# Patient Record
Sex: Male | Born: 2005 | Race: White | Hispanic: Yes | Marital: Married | Smoking: Never smoker
Health system: Southern US, Community
[De-identification: ages and names within clinical notes are randomized; demographics above are authoritative.]

## PROBLEM LIST (undated history)

## (undated) DIAGNOSIS — F909 Attention-deficit hyperactivity disorder, unspecified type: Secondary | ICD-10-CM

## (undated) DIAGNOSIS — F419 Anxiety disorder, unspecified: Secondary | ICD-10-CM

---
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
## 2024-01-12 ENCOUNTER — Telehealth: Payer: Self-pay | Admitting: Orthopedic Surgery

## 2024-02-05 ENCOUNTER — Ambulatory Visit: Admitting: Physician Assistant

## 2024-02-05 ENCOUNTER — Encounter: Payer: Self-pay | Admitting: Physician Assistant

## 2024-02-05 ENCOUNTER — Other Ambulatory Visit (HOSPITAL_BASED_OUTPATIENT_CLINIC_OR_DEPARTMENT_OTHER): Payer: Self-pay

## 2024-02-05 VITALS — BP 122/74 | HR 111 | Temp 97.2°F | Ht 64.04 in | Wt 193.0 lb

## 2024-02-05 DIAGNOSIS — J101 Influenza due to other identified influenza virus with other respiratory manifestations: Secondary | ICD-10-CM

## 2024-02-05 DIAGNOSIS — J020 Streptococcal pharyngitis: Secondary | ICD-10-CM

## 2024-02-05 LAB — POCT INFLUENZA A/B
Influenza A, POC: POSITIVE — AB
Influenza B, POC: NEGATIVE

## 2024-02-05 LAB — POCT RAPID STREP A (OFFICE): Rapid Strep A Screen: POSITIVE — AB

## 2024-02-05 LAB — POC COVID19 BINAXNOW: SARS Coronavirus 2 Ag: NEGATIVE

## 2024-02-05 MED ORDER — AMOXICILLIN 500 MG PO CAPS
500.0000 mg | ORAL_CAPSULE | Freq: Two times a day (BID) | ORAL | 0 refills | Status: AC
  Filled 2024-02-05: qty 20, 10d supply, fill #0

## 2024-02-05 NOTE — Progress Notes (Signed)
 "   Patient ID: Mark Beasley, male    DOB: April 08, 2005, 18 y.o.   MRN: [id number]   Assessment & Plan:  Influenza A -     POCT rapid strep A -     POC COVID-19 BinaxNow -     POCT Influenza A/B  Strep pharyngitis  Other orders -     Amoxicillin ; Take 1 capsule (500 mg total) by mouth 2 (two) times daily for 10 days.  Dispense: 20 capsule; Refill: 0     Assessment & Plan Influenza A Confirmed by positive test. Symptoms include cough, congestion, headache, rhinorrhea, and pharyngitis for five days. Beyond the 48-hour window for antiviral treatment, so Tamiflu  is not indicated. Expected duration of illness is 7-10 days, with potential for longer if complications arise. - Continue over-the-counter medications such as DayQuil and Nyquil for symptom relief. - Encourage hydration and electrolyte intake. - Advised rest and monitoring for worsening symptoms. - Recommended nasal spray or Breathe Right strips for nasal congestion.  Streptococcal pharyngitis Confirmed by positive test. Symptoms include sore throat and pharyngeal erythema. Amoxicillin  is indicated for treatment. - Prescribed amoxicillin  500 mg, twice daily for 10 days. - Advised to pick up medication from the pharmacy as soon as possible. - Instructed to complete the full course of antibiotics to eradicate strep throat.      Return if symptoms worsen or fail to improve.    Subjective:    Chief Complaint  Patient presents with   Cough    Pt c/o cough, runny nose, headache, nausea, sore throat, present for 5 days. Has tried daytime night and flu medicine and cough drops. Which did help slightly.     Cough   Discussed the use of AI scribe software for clinical note transcription with the patient, who gave verbal consent to proceed.  History of Present Illness Mark Beasley is a 18 year old male who presents with cough, congestion, headache, runny nose, and sore throat for the last five days.  He has been  experiencing cough, congestion, headache, runny nose, and sore throat for the past five days. Despite using over-the-counter medications like DayQuil, NyQuil, and Hall's cough drops, there has been no significant improvement in his symptoms.  His nasal congestion is severe, and he describes waking up to a 'puddle of mucus' after only two hours of sleep last night. Coughing causes pain in his chest and back, and he has been trying to suppress it.  He tested positive for both flu A and strep A in the office today. He has not received a flu shot this season.  No nausea, vomiting, diarrhea, or any stomach-related symptoms.     Past Medical History:  Diagnosis Date   ADHD    Anxiety     History reviewed. No pertinent surgical history.  History reviewed. No pertinent family history.  Social History[1]   Allergies[2]  Review of Systems  Respiratory:  Positive for cough.    NEGATIVE UNLESS OTHERWISE INDICATED IN HPI      Objective:     BP 122/74 (BP Location: Left Arm, Patient Position: Sitting, Cuff Size: Large)   Pulse (!) 111   Temp (!) 97.2 F (36.2 C) (Temporal)   Ht 5' 4.04 (1.627 m)   Wt 193 lb (87.5 kg)   SpO2 94%   BMI 33.09 kg/m   Wt Readings from Last 3 Encounters:  02/05/24 193 lb (87.5 kg) (90%, Z= 1.25)*  10/16/23 176 lb 3.2 oz (79.9 kg) (79%,  Z= 0.81)*  09/07/23 170 lb 6.4 oz (77.3 kg) (74%, Z= 0.64)*   * Growth percentiles are based on CDC (Boys, 2-20 Years) data.    BP Readings from Last 3 Encounters:  02/05/24 122/74  10/16/23 114/70  09/07/23 116/70     Physical Exam Vitals and nursing note reviewed.  Constitutional:      General: He is not in acute distress.    Appearance: Normal appearance. He is not ill-appearing.  HENT:     Head: Normocephalic.     Right Ear: Tympanic membrane, ear canal and external ear normal.     Left Ear: Tympanic membrane, ear canal and external ear normal.     Nose: Congestion present.     Mouth/Throat:      Mouth: Mucous membranes are moist.     Pharynx: Uvula midline. Posterior oropharyngeal erythema present.     Tonsils: No tonsillar exudate or tonsillar abscesses. 2+ on the right. 2+ on the left.  Eyes:     Extraocular Movements: Extraocular movements intact.     Conjunctiva/sclera: Conjunctivae normal.     Pupils: Pupils are equal, round, and reactive to light.  Cardiovascular:     Rate and Rhythm: Normal rate and regular rhythm.     Pulses: Normal pulses.     Heart sounds: Normal heart sounds. No murmur heard. Pulmonary:     Effort: Pulmonary effort is normal. No respiratory distress.     Breath sounds: Normal breath sounds. No wheezing.  Musculoskeletal:     Cervical back: Normal range of motion.  Skin:    General: Skin is warm.  Neurological:     Mental Status: He is alert and oriented to person, place, and time.  Psychiatric:        Mood and Affect: Mood normal.        Behavior: Behavior normal.             Mark Brayfield M Kysean Sweet, PA-C    [1]  Social History Tobacco Use   Smoking status: Never   Smokeless tobacco: Never  Vaping Use   Vaping status: Every Day  Substance Use Topics   Alcohol use: Never   Drug use: Not Currently    Comment: has experimented with marijuana once  [2]  Allergies Allergen Reactions   Cat Dander    "
